# Patient Record
Sex: Male | Born: 1980 | Race: Black or African American | Hispanic: No | Marital: Married | Smoking: Never smoker
Health system: Southern US, Community
[De-identification: ages and names within clinical notes are randomized; demographics above are authoritative.]

## PROBLEM LIST (undated history)

## (undated) DIAGNOSIS — K449 Diaphragmatic hernia without obstruction or gangrene: Secondary | ICD-10-CM

## (undated) DIAGNOSIS — I10 Essential (primary) hypertension: Secondary | ICD-10-CM

## (undated) HISTORY — PX: OTHER SURGICAL HISTORY: SHX169

---

## 2016-10-20 ENCOUNTER — Encounter: Payer: Self-pay | Admitting: Emergency Medicine

## 2016-10-20 ENCOUNTER — Emergency Department
Admission: EM | Admit: 2016-10-20 | Discharge: 2016-10-20 | Disposition: A | Discharge status: Home / Self Care | Payer: BC Managed Care – PPO | Attending: Emergency Medicine | Admitting: Emergency Medicine

## 2016-10-20 ENCOUNTER — Emergency Department: Payer: BC Managed Care – PPO

## 2016-10-20 DIAGNOSIS — R079 Chest pain, unspecified: Secondary | ICD-10-CM | POA: Diagnosis present

## 2016-10-20 DIAGNOSIS — I10 Essential (primary) hypertension: Secondary | ICD-10-CM | POA: Insufficient documentation

## 2016-10-20 DIAGNOSIS — J302 Other seasonal allergic rhinitis: Secondary | ICD-10-CM

## 2016-10-20 DIAGNOSIS — J9801 Acute bronchospasm: Secondary | ICD-10-CM | POA: Insufficient documentation

## 2016-10-20 DIAGNOSIS — J309 Allergic rhinitis, unspecified: Secondary | ICD-10-CM | POA: Diagnosis not present

## 2016-10-20 DIAGNOSIS — K219 Gastro-esophageal reflux disease without esophagitis: Secondary | ICD-10-CM | POA: Diagnosis not present

## 2016-10-20 HISTORY — DX: Essential (primary) hypertension: I10

## 2016-10-20 HISTORY — DX: Diaphragmatic hernia without obstruction or gangrene: K44.9

## 2016-10-20 LAB — BASIC METABOLIC PANEL
Anion gap: 7 (ref 5–15)
BUN: 14 mg/dL (ref 6–20)
CALCIUM: 9.2 mg/dL (ref 8.9–10.3)
CO2: 26 mmol/L (ref 22–32)
CREATININE: 1.34 mg/dL — AB (ref 0.61–1.24)
Chloride: 107 mmol/L (ref 101–111)
GFR calc Af Amer: >60 mL/min (ref >60)
GLUCOSE: 141 mg/dL — AB (ref 65–99)
POTASSIUM: 4.1 mmol/L (ref 3.5–5.1)
Sodium: 140 mmol/L (ref 135–145)

## 2016-10-20 LAB — CBC
HEMATOCRIT: 48.5 % (ref 40.0–52.0)
Hemoglobin: 17 g/dL (ref 13.0–18.0)
MCH: 28.5 pg (ref 26.0–34.0)
MCHC: 35 g/dL (ref 32.0–36.0)
MCV: 81.6 fL (ref 80.0–100.0)
Platelets: 148 10*3/uL — ABNORMAL LOW (ref 150–440)
RBC: 5.95 MIL/uL — ABNORMAL HIGH (ref 4.40–5.90)
RDW: 15.5 % — AB (ref 11.5–14.5)
WBC: 7 10*3/uL (ref 3.8–10.6)

## 2016-10-20 LAB — TROPONIN I: Troponin I: <0.03 ng/mL (ref <0.03)

## 2016-10-20 MED ORDER — SODIUM CHLORIDE 0.9 % IV BOLUS (SEPSIS)
1000.0000 mL | Freq: Once | INTRAVENOUS | Status: AC
  Administered 2016-10-20: 1000 mL via INTRAVENOUS

## 2016-10-20 MED ORDER — IOPAMIDOL (ISOVUE-370) INJECTION 76%
100.0000 mL | Freq: Once | INTRAVENOUS | Status: AC | PRN
  Administered 2016-10-20: 100 mL via INTRAVENOUS

## 2016-10-20 MED ORDER — PREDNISONE 10 MG PO TABS: ORAL_TABLET | ORAL | 0 refills | Status: AC

## 2016-10-20 MED ORDER — PREDNISONE 20 MG PO TABS
60.0000 mg | ORAL_TABLET | Freq: Once | ORAL | Status: AC
  Administered 2016-10-20: 60 mg via ORAL
  Filled 2016-10-20: qty 3

## 2016-10-20 MED ORDER — FAMOTIDINE 20 MG PO TABS: 20.0000 mg | ORAL_TABLET | Freq: Two times a day (BID) | ORAL | 0 refills | Status: AC

## 2016-10-20 MED ORDER — PREDNISONE 20 MG PO TABS: 40.0000 mg | ORAL_TABLET | Freq: Every day | ORAL | 0 refills | Status: AC

## 2016-10-20 MED ORDER — FAMOTIDINE 20 MG PO TABS
40.0000 mg | ORAL_TABLET | Freq: Once | ORAL | Status: AC
  Administered 2016-10-20: 40 mg via ORAL
  Filled 2016-10-20: qty 2

## 2016-10-20 MED ORDER — GI COCKTAIL ~~LOC~~
30.0000 mL | ORAL | Status: AC
  Administered 2016-10-20: 30 mL via ORAL
  Filled 2016-10-20: qty 30

## 2016-10-20 MED ORDER — IPRATROPIUM-ALBUTEROL 0.5-2.5 (3) MG/3ML IN SOLN
3.0000 mL | Freq: Once | RESPIRATORY_TRACT | Status: AC
  Administered 2016-10-20: 3 mL via RESPIRATORY_TRACT
  Filled 2016-10-20: qty 3

## 2016-10-20 MED ORDER — NAPROXEN 500 MG PO TABS: 500.0000 mg | ORAL_TABLET | Freq: Two times a day (BID) | ORAL | 0 refills | Status: AC

## 2016-10-20 NOTE — ED Provider Notes (Signed)
[facility] Emergency Department Provider Note  ____________________________________________  Time seen: Approximately 8:04 PM  I have reviewed the triage vital signs and the nursing notes.   HISTORY  Chief Complaint Chest Pain    HPI Donald Zimmerman is a 36 y.o. male who complains of chest tightness, anterior, nonradiating, worse with exertion, no alleviating factors. Not positional. Associated with shortness of breath. No vomiting or diaphoresis. No dizziness or syncope. Pain is mild in severity.  Patient notes that he recently moved into a new house. He's been doing a lot of moving recently. He did not have pain with moving a large furniture pieces, but just over last couple days he's been having more shortness of breath with even minimal exertion with walking up steps.He also notes nasal congestion and runny nose. He is from [place] and not familiar with this environment and feels like he is having a lot of allergies.     Past Medical History:  Diagnosis Date  . Hiatal hernia   . Hypertension      There are no active problems to display for this patient.    Past Surgical History:  Procedure Laterality Date  . great toe surgery       Prior to Admission medications   Medication Sig Start Date End Date Taking? Authorizing Provider  Multiple Vitamin (MULTIVITAMIN) tablet Take 1 tablet by mouth daily.   Yes [provider]  famotidine (PEPCID) 20 MG tablet Take 1 tablet (20 mg total) by mouth 2 (two) times daily. 10/20/16   Sharman Cheek, MD  naproxen (NAPROSYN) 500 MG tablet Take 1 tablet (500 mg total) by mouth 2 (two) times daily with a meal. 10/20/16   Sharman Cheek, MD  predniSONE (DELTASONE) 10 MG tablet Take five tablets a day (50 mg) for 3 days,  Then four tablets a day (40 mg) for 3 days  Then three tablets a day (30 mg) for 3 days  Then two tablets a day (20 mg) for 5 days  Then one tablet a day (10 mg) for 5 days 10/20/16    Sharman Cheek, MD  predniSONE (DELTASONE) 20 MG tablet Take 2 tablets (40 mg total) by mouth daily. 10/20/16   Sharman Cheek, MD     Allergies Penicillins   History reviewed. No pertinent family history.  Social History Social History  Substance Use Topics  . Smoking status: Never Smoker  . Smokeless tobacco: Never Used  . Alcohol use Yes    Review of Systems  Constitutional:   No fever or chills.  ENT:   No sore throat. No rhinorrhea. Cardiovascular:   Positive chest pain as above. Respiratory:  Positive shortness of breath without cough. Gastrointestinal:   Negative for abdominal pain, vomiting and diarrhea.  Musculoskeletal:   Negative for focal pain or swelling All other systems reviewed and are negative except as documented above in ROS and HPI.  ____________________________________________   PHYSICAL EXAM:  VITAL SIGNS: ED Triage Vitals  Enc Vitals Group     BP 10/20/16 1350 (!) 171/91     Pulse Rate 10/20/16 1350 (!) 106     Resp 10/20/16 1350 (!) 22     Temp 10/20/16 1350 99.3 F (37.4 C)     Temp Source 10/20/16 1350 Oral     SpO2 10/20/16 1350 96 %     Weight 10/20/16 1350 (!) 400 lb (181.4 kg)     Height 10/20/16 1350 6\' 6"  (1.981 m)     Head Circumference --  Peak Flow --      Pain Score 10/20/16 1359 10     Pain Loc --      Pain Edu? --      Excl. in GC? --     Vital signs reviewed, nursing assessments reviewed.   Constitutional:   Alert and oriented. Well appearing and in no distress. Eyes:   No scleral icterus.  EOMI. No nystagmus. No conjunctival pallor. PERRL. ENT   Head:   Normocephalic and atraumatic.   Nose:   Positive boggy turbinates and bilateral nasal congestion.    Mouth/Throat:   MMM, no pharyngeal erythema. No peritonsillar mass.    Neck:   No meningismus. Full ROM Hematological/Lymphatic/Immunilogical:   No cervical lymphadenopathy. Cardiovascular:   RRR. Symmetric bilateral radial and DP pulses.  No  murmurs.  Respiratory:   Normal respiratory effort without tachypnea/retractions. Diffuse expiratory wheezing without focal crackles Gastrointestinal:   Soft and nontender. Non distended. There is no CVA tenderness.  No rebound, rigidity, or guarding. Genitourinary:   deferred Musculoskeletal:   Normal range of motion in all extremities. No joint effusions.  No lower extremity tenderness.  No edema. Neurologic:   Normal speech and language.  Motor grossly intact. No gross focal neurologic deficits are appreciated.  Skin:    Skin is warm, dry and intact. No rash noted.  No petechiae, purpura, or bullae.  ____________________________________________    LABS (pertinent positives/negatives) (all labs ordered are listed, but only abnormal results are displayed) Labs Reviewed  BASIC METABOLIC PANEL - Abnormal; Notable for the following:       Result Value   Glucose, Bld 141 (*)    Creatinine, Ser 1.34 (*)    All other components within normal limits  CBC - Abnormal; Notable for the following:    RBC 5.95 (*)    RDW 15.5 (*)    Platelets 148 (*)    All other components within normal limits  TROPONIN I  TROPONIN I   ____________________________________________   EKG  Interpreted by me  Date: 10/20/2016  Rate: 92  Rhythm: normal sinus rhythm  QRS Axis: normal  Intervals: normal  ST/T Wave abnormalities: normal  Conduction Disutrbances: none  Narrative Interpretation: unremarkable      ____________________________________________    RADIOLOGY  Dg Chest 2 View  Result Date: 10/20/2016 CLINICAL DATA:  Chest tightness EXAM: CHEST  2 VIEW COMPARISON:  None. FINDINGS: The heart size and mediastinal contours are within normal limits. Both lungs are clear. The visualized skeletal structures are unremarkable. IMPRESSION: No active cardiopulmonary disease. Electronically Signed   By: Kennith Center M.D.   On: 10/20/2016 14:33   Ct Angio Chest Aorta W And/or Wo Contrast  Result  Date: 10/20/2016 CLINICAL DATA:  Chest tightness and shortness of breath EXAM: CT ANGIOGRAPHY CHEST WITH CONTRAST TECHNIQUE: Multidetector CT imaging of the chest was performed using the standard protocol during bolus administration of intravenous contrast. Multiplanar CT image reconstructions and MIPs were obtained to evaluate the vascular anatomy. CONTRAST:  100 mL Isovue 370 intravenous COMPARISON:  10/20/2016 FINDINGS: Cardiovascular: Non contrasted images demonstrate no evidence for intramural hematoma. Ectatic ascending aorta measuring up to 3.8 cm in diameter. No dissection is seen. Heart size is normal. No pericardial effusion. Central pulmonary artery branch vessels are without significant filling defect. Mediastinum/Nodes: No enlarged mediastinal, hilar, or axillary lymph nodes. Thyroid gland, trachea, and esophagus demonstrate no significant findings. Lungs/Pleura: Lungs are clear. No pleural effusion or pneumothorax. Upper Abdomen: No acute abnormality. Musculoskeletal: No chest  wall abnormality. No acute or significant osseous findings. Review of the MIP images confirms the above findings. IMPRESSION: 1. Ectasia of the ascending aorta, but no evidence for aortic dissection. 2. Clear lung fields Electronically Signed   By: Jasmine PangKim  Fujinaga M.D.   On: 10/20/2016 19:38    ____________________________________________   PROCEDURES Procedures  ____________________________________________   INITIAL IMPRESSION / ASSESSMENT AND PLAN / ED COURSE  Pertinent labs & imaging results that were available during my care of the patient were reviewed by me and considered in my medical decision making (see chart for details).  Patient presents with atypical chest pain. Exam consistent with acute bronchospasm, likely related to moving and allergies with his nasal findings as well. We'll treat with prednisone and bronchodilators. Antacids for possible GERD as well. Due to his history of a enlarged ascending  aorta, we'll get a CT injured him to evaluate for dissection and check a delta troponin.  Clinical Course as of Oct 21 2002  Wynelle LinkSun Oct 20, 2016  1846 Peripheral IV insertion by physician Indication: Multiple failed attempts by nursing staff, need for IV access and/or blood samples for workup Performed under continuous real-time ultrasound visualization Area cleaned with chlorhexidine. 20-gauge IV successfully placed in the right antecubital fossa. 1 attempt, no complications, EBL 0.    [PS]    Clinical Course User Index [PS] Sharman CheekStafford, Breydan Shillingburg, MD     ----------------------------------------- 8:07 PM on 10/20/2016 -----------------------------------------  Workup negative. At 7 PM, patient was feeling much better after medications. Repeat lung auscultation reveals wheezing has resolved. Consistent with bronchospasm, likely allergic, follow-up with primary care, prednisone and albuterol.Considering the patient's symptoms, medical history, and physical examination today, I have low suspicion for ACS, PE, TAD, pneumothorax, carditis, mediastinitis, pneumonia, CHF, or sepsis.    ____________________________________________   FINAL CLINICAL IMPRESSION(S) / ED DIAGNOSES  Final diagnoses:  Seasonal allergic rhinitis, unspecified trigger  Bronchospasm, acute  Nonspecific chest pain  Gastroesophageal reflux disease, esophagitis presence not specified      New Prescriptions   FAMOTIDINE (PEPCID) 20 MG TABLET    Take 1 tablet (20 mg total) by mouth 2 (two) times daily.   NAPROXEN (NAPROSYN) 500 MG TABLET    Take 1 tablet (500 mg total) by mouth 2 (two) times daily with a meal.   PREDNISONE (DELTASONE) 10 MG TABLET    Take five tablets a day (50 mg) for 3 days,  Then four tablets a day (40 mg) for 3 days  Then three tablets a day (30 mg) for 3 days  Then two tablets a day (20 mg) for 5 days  Then one tablet a day (10 mg) for 5 days   PREDNISONE (DELTASONE) 20 MG TABLET    Take 2  tablets (40 mg total) by mouth daily.     Portions of this note were generated with dragon dictation software. Dictation errors may occur despite best attempts at proofreading.    Sharman CheekStafford, Jaaziel Peatross, MD 10/20/16 2010

## 2016-10-20 NOTE — Discharge Instructions (Signed)
Your blood tests and CT scan of the chest were unremarkable today. Follow-up with your doctor for further management. your symptoms appear to be due to allergies and tightening of the airways.

## 2016-10-20 NOTE — ED Triage Notes (Signed)
Pt arrived via POV from home with reports of chest tightness and "feeling like I'm drowning" pt states he has been having shortness of breath at rest and with exertion. Pt reports sxs started a few days ago and became worse today after walking up the stairs.

## 2017-12-21 ENCOUNTER — Emergency Department
Admission: EM | Admit: 2017-12-21 | Discharge: 2017-12-21 | Disposition: A | Discharge status: Home / Self Care | Payer: BC Managed Care – PPO | Attending: Emergency Medicine | Admitting: Emergency Medicine

## 2017-12-21 ENCOUNTER — Encounter: Payer: Self-pay | Admitting: Emergency Medicine

## 2017-12-21 ENCOUNTER — Other Ambulatory Visit: Payer: Self-pay

## 2017-12-21 ENCOUNTER — Emergency Department: Payer: BC Managed Care – PPO

## 2017-12-21 DIAGNOSIS — I10 Essential (primary) hypertension: Secondary | ICD-10-CM | POA: Insufficient documentation

## 2017-12-21 DIAGNOSIS — R05 Cough: Secondary | ICD-10-CM | POA: Diagnosis present

## 2017-12-21 DIAGNOSIS — J9801 Acute bronchospasm: Secondary | ICD-10-CM | POA: Insufficient documentation

## 2017-12-21 DIAGNOSIS — J40 Bronchitis, not specified as acute or chronic: Secondary | ICD-10-CM | POA: Insufficient documentation

## 2017-12-21 DIAGNOSIS — J209 Acute bronchitis, unspecified: Secondary | ICD-10-CM

## 2017-12-21 LAB — CBC
HEMATOCRIT: 48.1 % (ref 40.0–52.0)
HEMOGLOBIN: 17.3 g/dL (ref 13.0–18.0)
MCH: 29.8 pg (ref 26.0–34.0)
MCHC: 36 g/dL (ref 32.0–36.0)
MCV: 82.9 fL (ref 80.0–100.0)
Platelets: 147 10*3/uL — ABNORMAL LOW (ref 150–440)
RBC: 5.8 MIL/uL (ref 4.40–5.90)
RDW: 15.5 % — AB (ref 11.5–14.5)
WBC: 7.4 10*3/uL (ref 3.8–10.6)

## 2017-12-21 LAB — BASIC METABOLIC PANEL
ANION GAP: 6 (ref 5–15)
BUN: 13 mg/dL (ref 6–20)
CHLORIDE: 109 mmol/L (ref 98–111)
CO2: 25 mmol/L (ref 22–32)
Calcium: 9.1 mg/dL (ref 8.9–10.3)
Creatinine, Ser: 1.15 mg/dL (ref 0.61–1.24)
GFR calc non Af Amer: >60 mL/min (ref >60)
GLUCOSE: 121 mg/dL — AB (ref 70–99)
POTASSIUM: 3.9 mmol/L (ref 3.5–5.1)
Sodium: 140 mmol/L (ref 135–145)

## 2017-12-21 LAB — TROPONIN I: Troponin I: <0.03 ng/mL (ref <0.03)

## 2017-12-21 MED ORDER — ALBUTEROL SULFATE HFA 108 (90 BASE) MCG/ACT IN AERS: 2.0000 | INHALATION_SPRAY | Freq: Four times a day (QID) | RESPIRATORY_TRACT | 2 refills | Status: AC | PRN

## 2017-12-21 MED ORDER — DEXAMETHASONE SODIUM PHOSPHATE 10 MG/ML IJ SOLN
10.0000 mg | Freq: Once | INTRAMUSCULAR | Status: AC
  Administered 2017-12-21: 10 mg via INTRAMUSCULAR
  Filled 2017-12-21: qty 1

## 2017-12-21 MED ORDER — AZITHROMYCIN 250 MG PO TABS: ORAL_TABLET | ORAL | 0 refills | Status: DC

## 2017-12-21 MED ORDER — IPRATROPIUM-ALBUTEROL 0.5-2.5 (3) MG/3ML IN SOLN
3.0000 mL | Freq: Once | RESPIRATORY_TRACT | Status: AC
  Administered 2017-12-21: 3 mL via RESPIRATORY_TRACT
  Filled 2017-12-21: qty 3

## 2017-12-21 MED ORDER — AZITHROMYCIN 250 MG PO TABS: ORAL_TABLET | ORAL | 0 refills | Status: AC

## 2017-12-21 NOTE — ED Notes (Signed)
[facility]: pt has been having cough and wheezing with tightness to chest

## 2017-12-21 NOTE — ED Provider Notes (Signed)
[facility] Emergency Department Provider Note   ____________________________________________    I have reviewed the triage vital signs and the nursing notes.   HISTORY  Chief Complaint Cough and Generalized Body Aches     HPI Donald Zimmerman is a 37 y.o. male who presents with cough, intermittent mild shortness of breath as well as myalgias.  Patient reports symptoms been ongoing for the last 2 days.  He denies chest pain.  He is not sure if he has had any fevers.  States this feels similar to bronchitis he has had in the past.  Does have a history of hypertension but denies any heart disease or lung disease.  Does not smoke.  No calf pain or swelling.  No recent travel   Past Medical History:  Diagnosis Date  . Hiatal hernia   . Hypertension     There are no active problems to display for this patient.   Past Surgical History:  Procedure Laterality Date  . great toe surgery      Prior to Admission medications   Medication Sig Start Date End Date Taking? Authorizing Provider  albuterol (PROVENTIL HFA;VENTOLIN HFA) 108 (90 Base) MCG/ACT inhaler Inhale 2 puffs into the lungs every 6 (six) hours as needed for wheezing or shortness of breath. 12/21/17   Jene Every, MD  azithromycin (ZITHROMAX Z-PAK) 250 MG tablet Take 2 tablets (500 mg) on  Day 1,  followed by 1 tablet (250 mg) once daily on Days 2 through 5. 12/21/17 12/26/17  Jene Every, MD  famotidine (PEPCID) 20 MG tablet Take 1 tablet (20 mg total) by mouth 2 (two) times daily. 10/20/16   Sharman Cheek, MD  Multiple Vitamin (MULTIVITAMIN) tablet Take 1 tablet by mouth daily.    [provider]  naproxen (NAPROSYN) 500 MG tablet Take 1 tablet (500 mg total) by mouth 2 (two) times daily with a meal. 10/20/16   Sharman Cheek, MD  predniSONE (DELTASONE) 10 MG tablet Take five tablets a day (50 mg) for 3 days,  Then four tablets a day (40 mg) for 3 days  Then three tablets a day (30  mg) for 3 days  Then two tablets a day (20 mg) for 5 days  Then one tablet a day (10 mg) for 5 days 10/20/16   Sharman Cheek, MD  predniSONE (DELTASONE) 20 MG tablet Take 2 tablets (40 mg total) by mouth daily. 10/20/16   Sharman Cheek, MD     Allergies Penicillins  No family history on file.  Social History Social History   Tobacco Use  . Smoking status: Never Smoker  . Smokeless tobacco: Never Used  Substance Use Topics  . Alcohol use: Yes  . Drug use: Not on file    Review of Systems  Constitutional: As above Eyes: No visual changes.  ENT: No sore throat. Cardiovascular: Denies chest pain. Respiratory: As above Gastrointestinal: No abdominal pain.  No nausea, no vomiting.   Genitourinary: Negative for dysuria. Musculoskeletal: Myalgias as above Skin: Negative for rash. Neurological: Negative for headaches or weakness   ____________________________________________   PHYSICAL EXAM:  VITAL SIGNS: ED Triage Vitals  Enc Vitals Group     BP 12/21/17 1552 (!) 157/111     Pulse Rate 12/21/17 1552 93     Resp 12/21/17 1552 18     Temp 12/21/17 1552 99.3 F (37.4 C)     Temp Source 12/21/17 1552 Oral     SpO2 12/21/17 1552 96 %  Weight 12/21/17 1553 (!) 181.4 kg (400 lb)     Height 12/21/17 1553 1.981 m (6\' 6" )     Head Circumference --      Peak Flow --      Pain Score 12/21/17 1553 9     Pain Loc --      Pain Edu? --      Excl. in GC? --     Constitutional: Alert and oriented. No acute distress.   Nose: No congestion/rhinnorhea. Mouth/Throat: Mucous membranes are moist.    Cardiovascular: Normal rate, regular rhythm. Grossly normal heart sounds.  Good peripheral circulation. Respiratory: Normal respiratory effort.  No retractions.  Scattered mild wheezes Gastrointestinal: Soft and nontender. No distention.    Musculoskeletal: No lower extremity tenderness nor edema.  Warm and well perfused Neurologic:  Normal speech and language. No gross focal  neurologic deficits are appreciated.  Skin:  Skin is warm, dry and intact. No rash noted. Psychiatric: Mood and affect are normal. Speech and behavior are normal.  ____________________________________________   LABS (all labs ordered are listed, but only abnormal results are displayed)  Labs Reviewed  BASIC METABOLIC PANEL - Abnormal; Notable for the following components:      Result Value   Glucose, Bld 121 (*)    All other components within normal limits  CBC - Abnormal; Notable for the following components:   RDW 15.5 (*)    Platelets 147 (*)    All other components within normal limits  TROPONIN I   ____________________________________________  EKG  ED ECG REPORT I, Jene Every, the attending physician, personally viewed and interpreted this ECG.  Date: 12/21/2017  Rhythm: normal sinus rhythm QRS Axis: normal Intervals: normal ST/T Wave abnormalities: normal Narrative Interpretation: no evidence of acute ischemia  ____________________________________________  RADIOLOGY  Chest x-ray demonstrates mild bronchitic changes ____________________________________________   PROCEDURES  Procedure(s) performed: No  Procedures   Critical Care performed: No ____________________________________________   INITIAL IMPRESSION / ASSESSMENT AND PLAN / ED COURSE  Pertinent labs & imaging results that were available during my care of the patient were reviewed by me and considered in my medical decision making (see chart for details).  Patient presents with complaints of mild cough, intermittent short of breath, feels quite well now.  Scattered wheezing on exam, suspect bronchitis with bronchospasm.  Treated with DuoNeb with improvement in wheezing.  IM Decadron given as well.  Outpatient follow-up is appropriate, strict return precautions if any worsening    ____________________________________________   FINAL CLINICAL IMPRESSION(S) / ED DIAGNOSES  Final diagnoses:    Bronchospasm with bronchitis, acute        Note:  This document was prepared using Dragon voice recognition software and may include unintentional dictation errors.    Jene Every, MD 12/21/17 2033

## 2017-12-21 NOTE — ED Triage Notes (Signed)
Pt arrives via POV from home with complaints of productive cough x 3-4 days with chest tightness, body aches and abdominal pain. Chest tightness was intermittent until today, when it hasn't gone away.  Denies fever.

## 2017-12-21 NOTE — ED Notes (Signed)
Pt taken to xray 

## 2018-04-10 IMAGING — CT CT ANGIO CHEST
3 of 7 series · 18 of 36 positions shown · IV contrast (APPLIED)
Comparison: 10/20/2016

CLINICAL DATA: Chest tightness and shortness of breath

EXAM:
CT ANGIOGRAPHY CHEST WITH CONTRAST
TECHNIQUE: Multidetector CT imaging of the chest was performed using the
standard protocol during bolus administration of intravenous
contrast. Multiplanar CT image reconstructions and MIPs were
obtained to evaluate the vascular anatomy.
CONTRAST:  100 mL Isovue 370 intravenous

[Series 6: axial arterial · axial · arterial · 0.83mm/px · z∈[-415,-103]mm · 11 of 126 slices shown]
[im 11/126  lung]
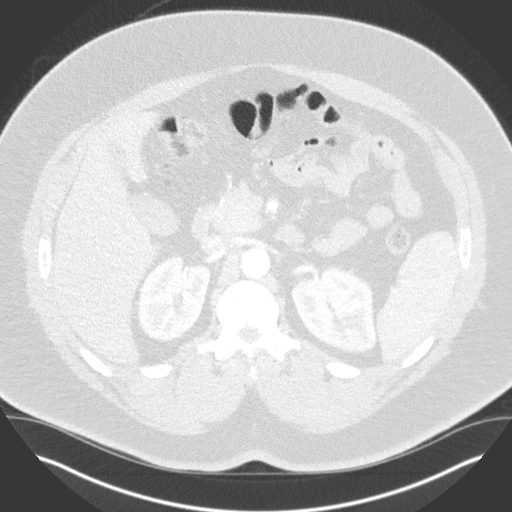
[im 21/126  mediastinal]
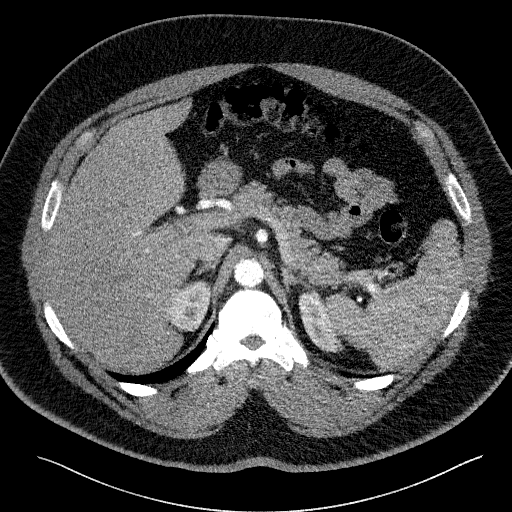
[im 32/126  lung]
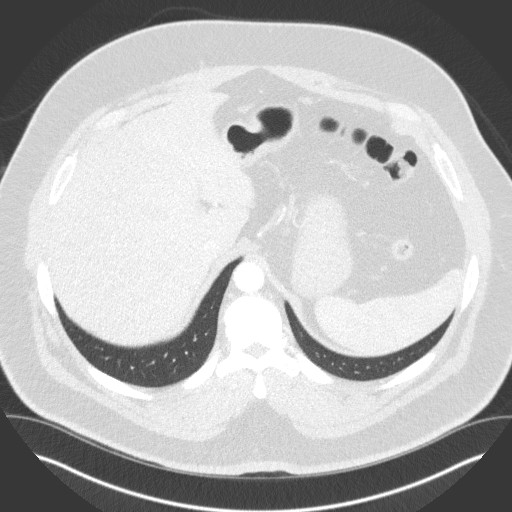
[im 42/126  mediastinal]
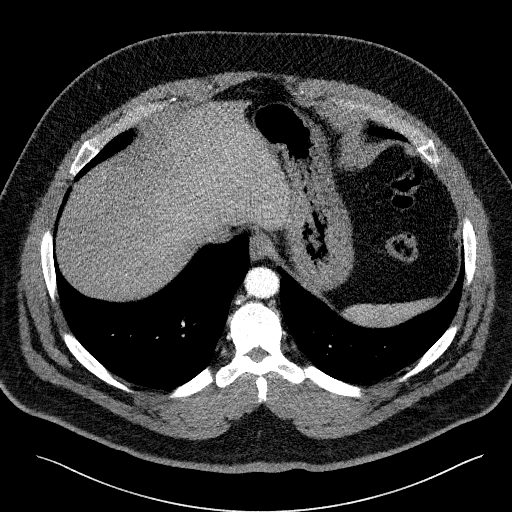
[im 53/126  lung]
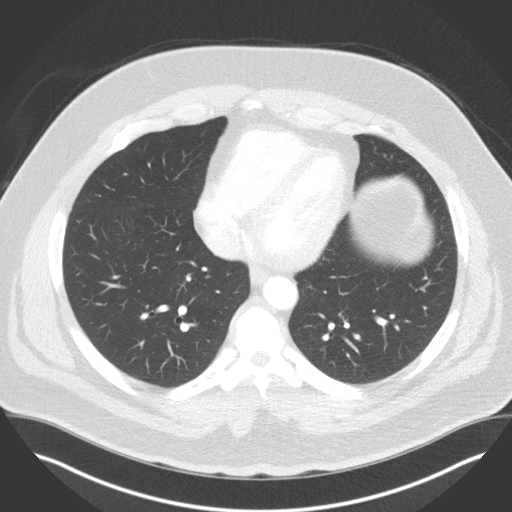
[im 63/126  mediastinal]
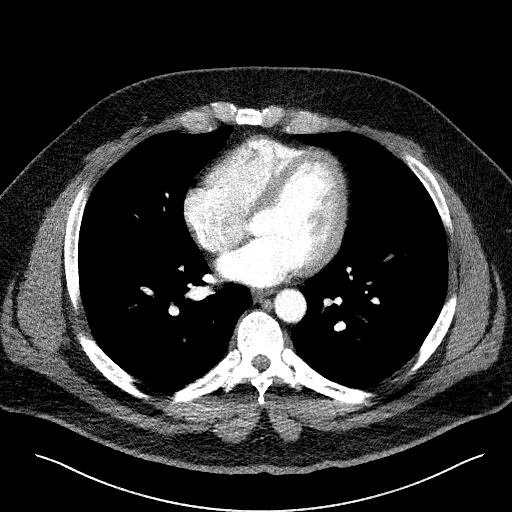
[im 73/126  lung]
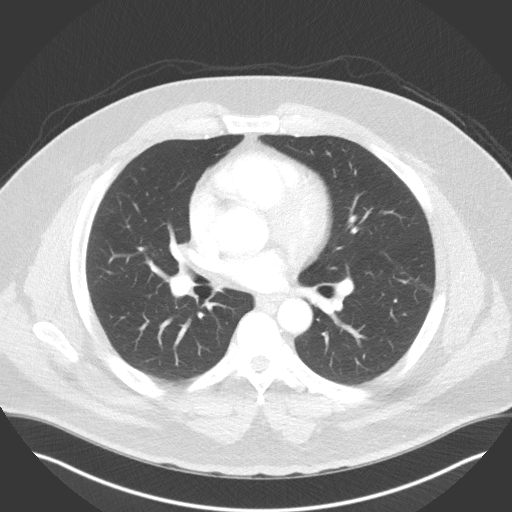
[im 84/126  mediastinal]
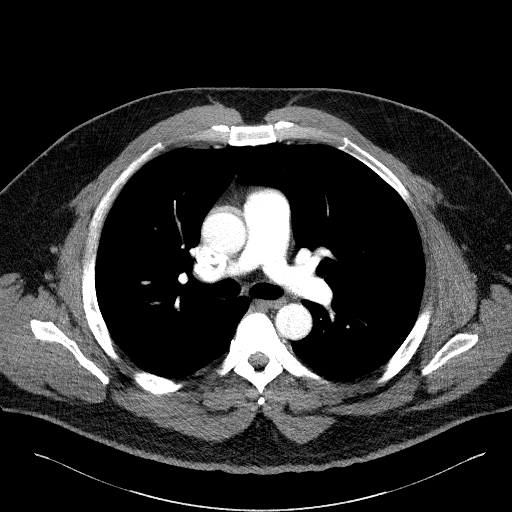
[im 94/126  lung]
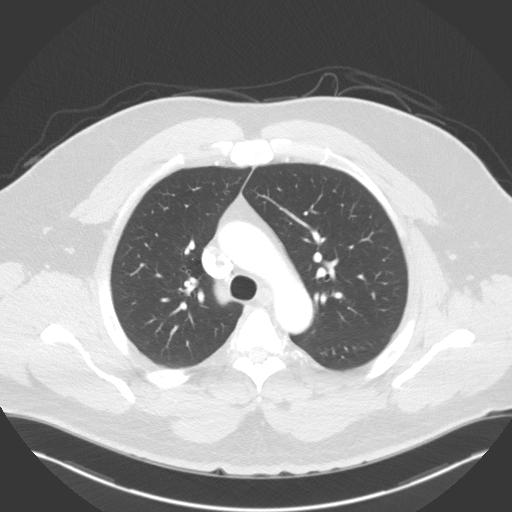
[im 105/126  mediastinal]
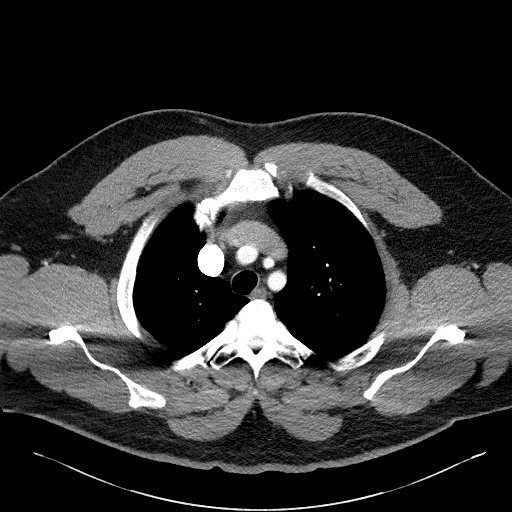
[im 115/126  lung]
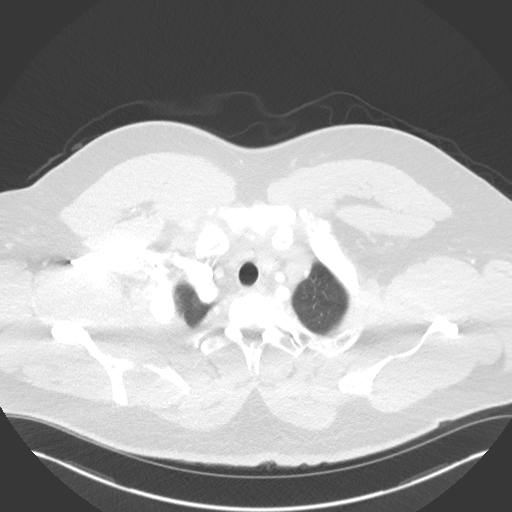

[Series 7: lung · axial · 0.83mm/px · z∈[-395,-125]mm · 6 of 76 slices shown]
[im 11/76  mediastinal]
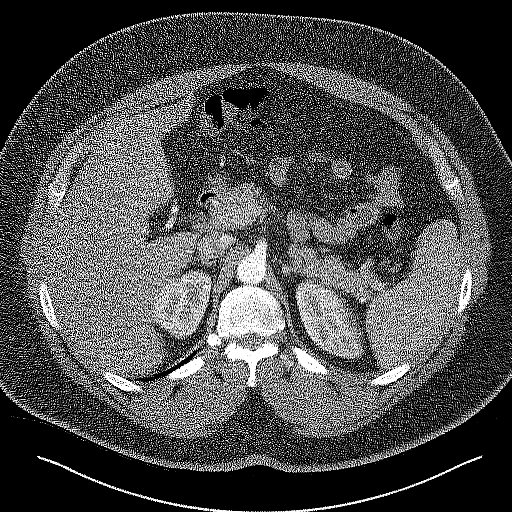
[im 22/76  mediastinal]
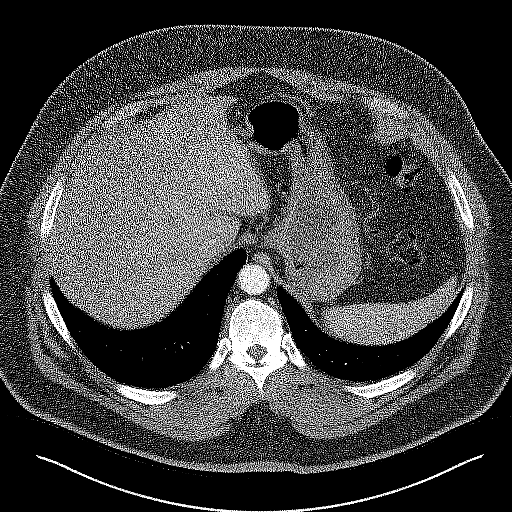
[im 33/76  mediastinal]
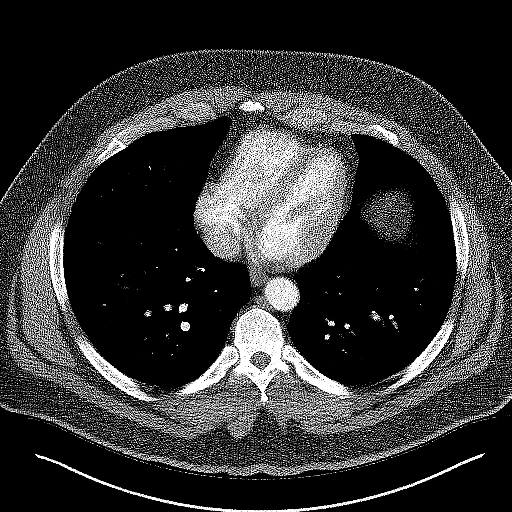
[im 43/76  mediastinal]
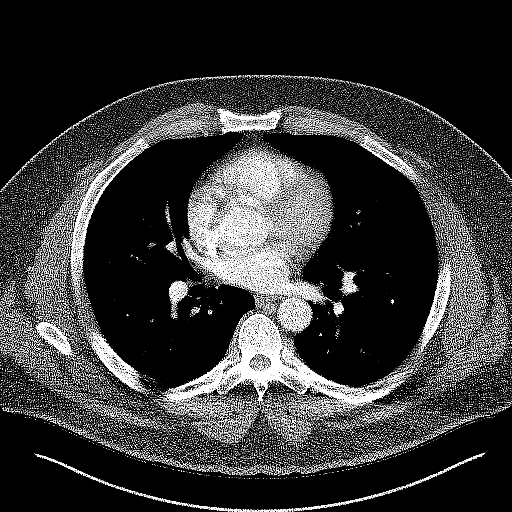
[im 54/76  mediastinal]
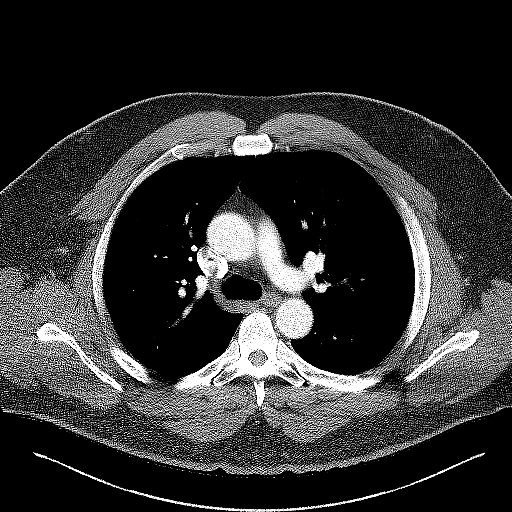
[im 65/76  mediastinal]
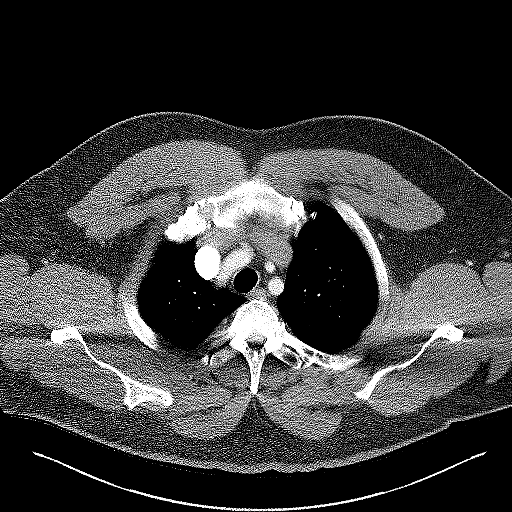

[Series 9: coronals · coronal · 0.78mm/px · 1 of 175 slices shown]
[im 88/175  mediastinal]
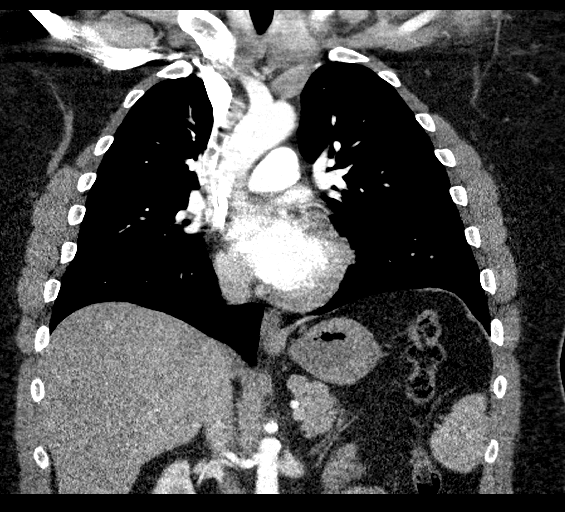

[18 of 36 positions shown; findings below may reference images not displayed]

FINDINGS: Cardiovascular: Non contrasted images demonstrate no evidence for
intramural hematoma. Ectatic ascending aorta measuring up to 3.8 cm
in diameter. No dissection is seen. Heart size is normal. No
pericardial effusion. Central pulmonary artery branch vessels are
without significant filling defect.

Mediastinum/Nodes: No enlarged mediastinal, hilar, or axillary lymph
nodes. Thyroid gland, trachea, and esophagus demonstrate no
significant findings.

Lungs/Pleura: Lungs are clear. No pleural effusion or pneumothorax.

Upper Abdomen: No acute abnormality.

Musculoskeletal: No chest wall abnormality. No acute or significant
osseous findings.

Review of the MIP images confirms the above findings.
IMPRESSION: 1. Ectasia of the ascending aorta, but no evidence for aortic
dissection.
2. Clear lung fields

## 2019-06-11 IMAGING — CR DG CHEST 2V
2 series · 2 of 2 positions shown · non-contrast
Comparison: 10/20/2016

CLINICAL DATA: Chest tightness and increasing shortness of breath.

EXAM:
CHEST - 2 VIEW

[chest pa]
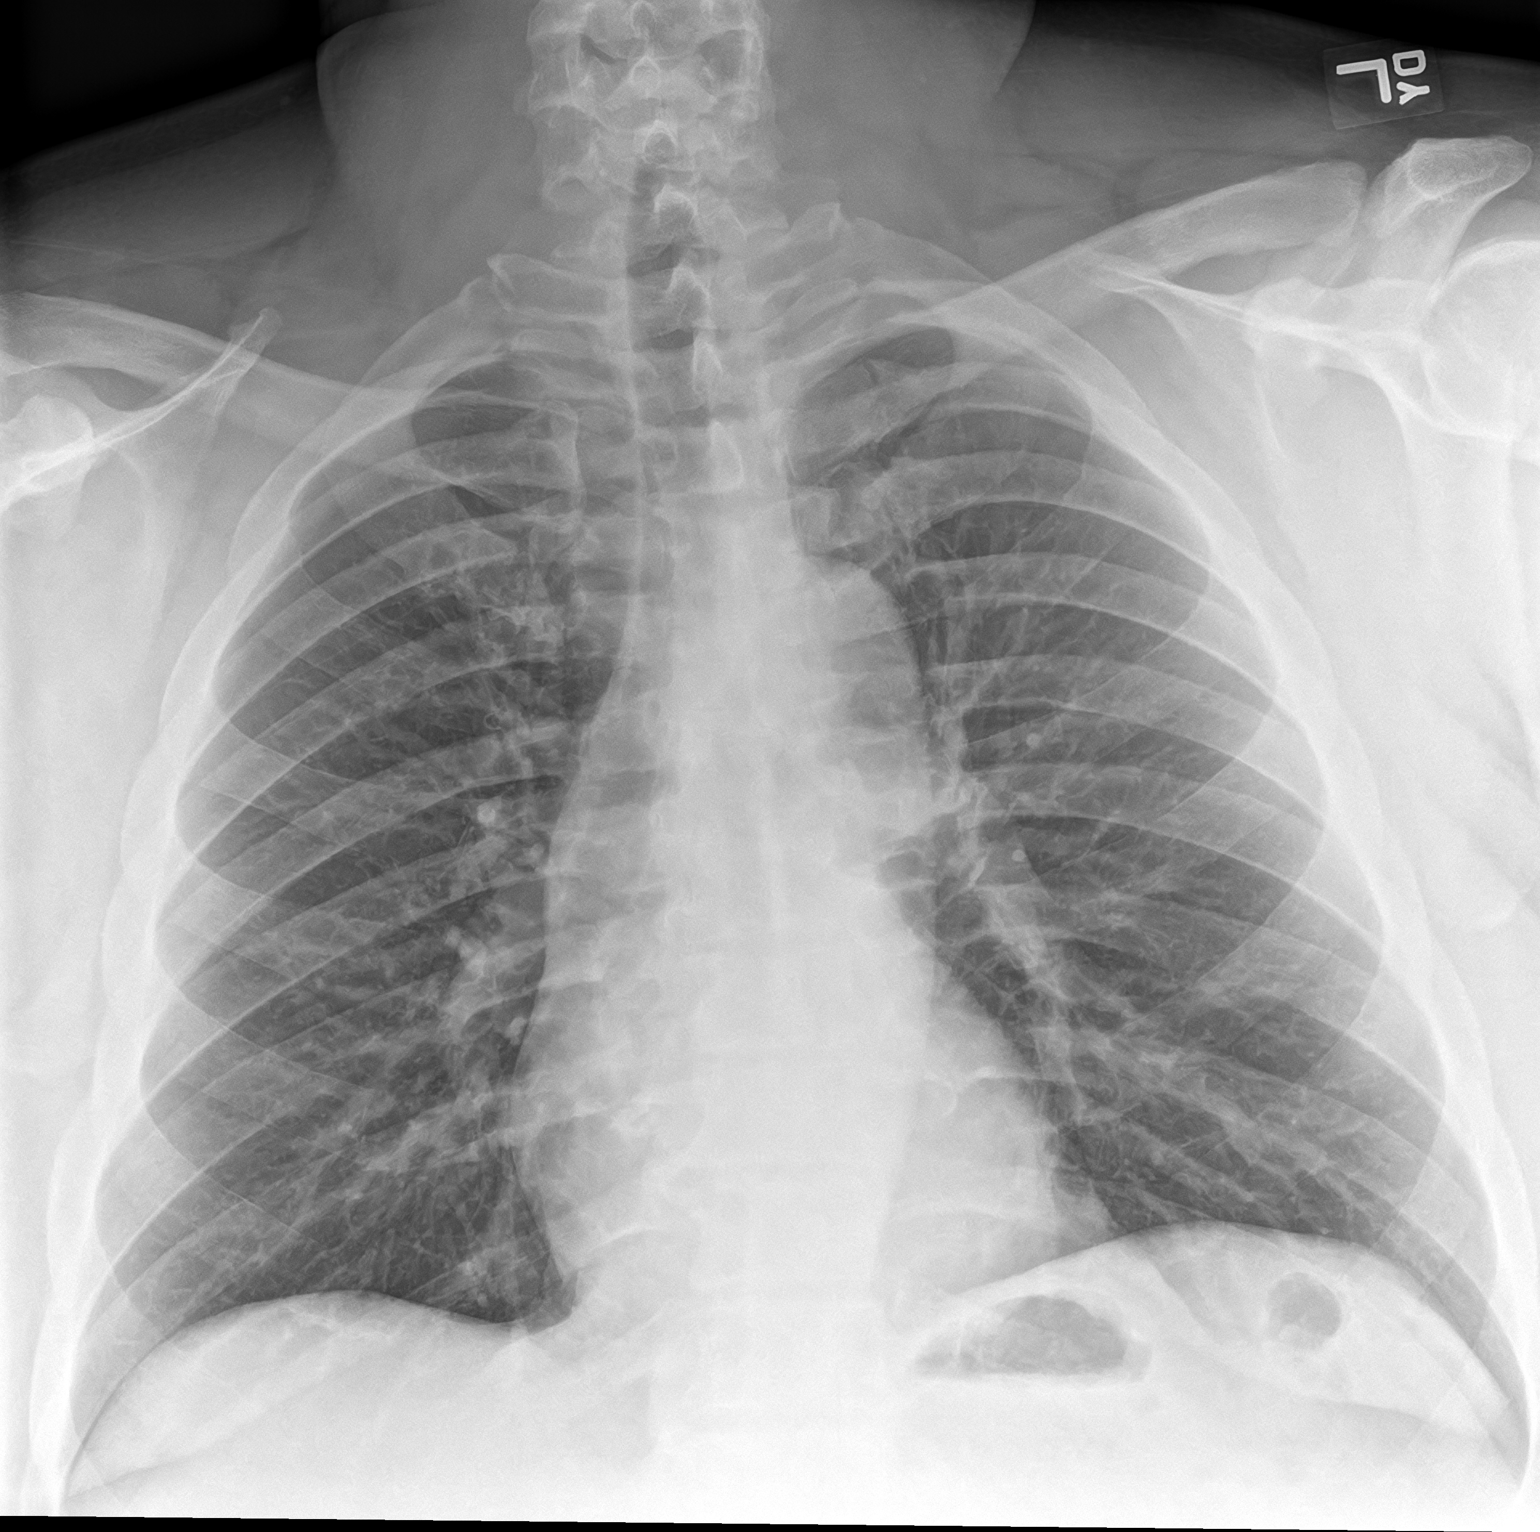

[chest lat]
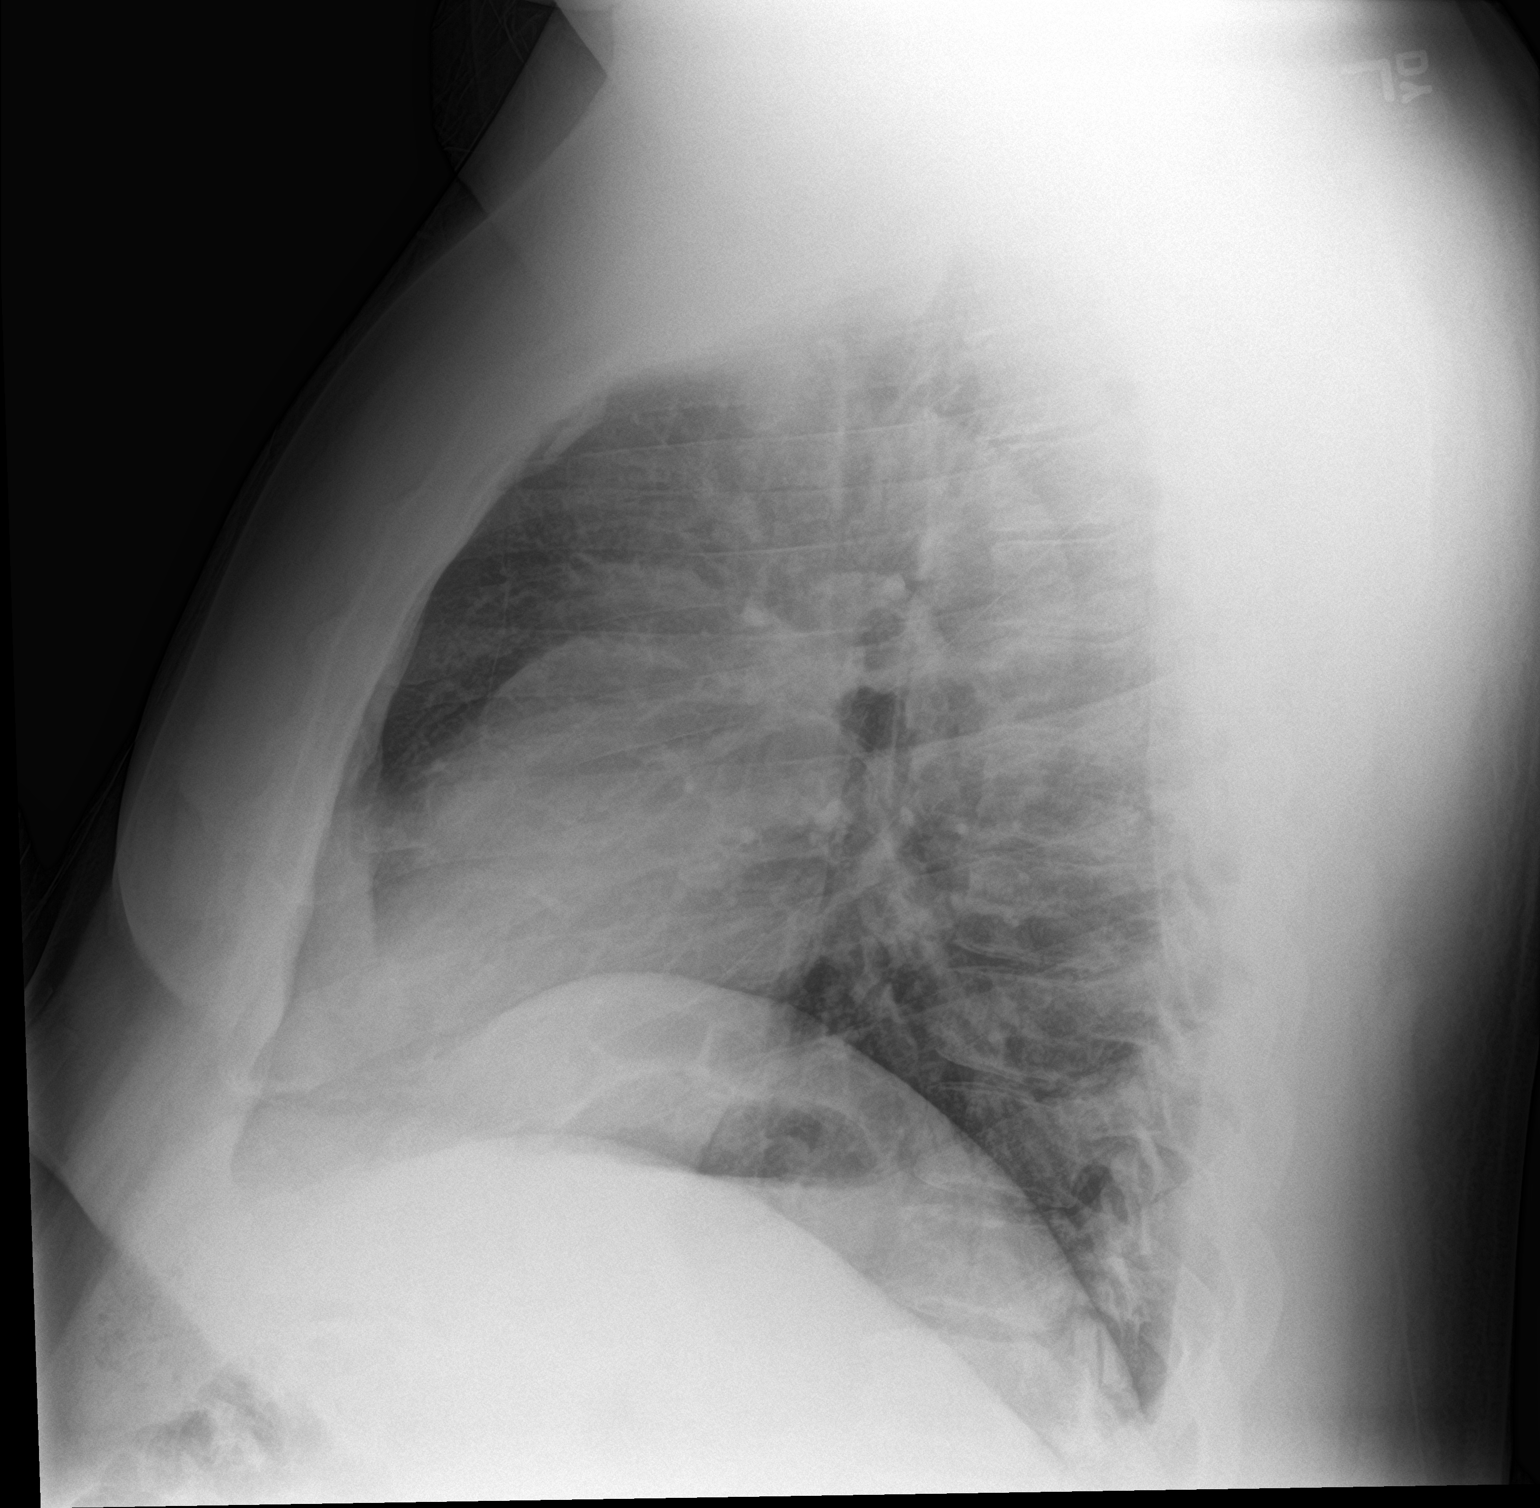

[2 of 2 positions shown; findings below may reference images not displayed]

FINDINGS: Normal sized heart. Tortuous aorta. Clear lungs. Stable minimal
peribronchial thickening. Unremarkable bones.
IMPRESSION: Stable minimal chronic bronchitic changes. No acute abnormality.
